# Patient Record
Sex: Female | Born: 1955 | Hispanic: Yes | Marital: Single | Smoking: Never smoker
Health system: Southern US, Community
[De-identification: ages and names within clinical notes are randomized; demographics above are authoritative.]

## PROBLEM LIST (undated history)

## (undated) DIAGNOSIS — C801 Malignant (primary) neoplasm, unspecified: Secondary | ICD-10-CM

## (undated) HISTORY — PX: MASTECTOMY: SHX3

## (undated) HISTORY — PX: BLADDER SURGERY: SHX569

---

## 2017-06-18 ENCOUNTER — Emergency Department (HOSPITAL_COMMUNITY)
Admission: EM | Admit: 2017-06-18 | Discharge: 2017-06-19 | Disposition: A | Discharge status: Home / Self Care | Payer: Medicaid Other | Attending: Emergency Medicine | Admitting: Emergency Medicine

## 2017-06-18 ENCOUNTER — Encounter (HOSPITAL_COMMUNITY): Payer: Self-pay | Admitting: Emergency Medicine

## 2017-06-18 DIAGNOSIS — R1013 Epigastric pain: Secondary | ICD-10-CM | POA: Diagnosis present

## 2017-06-18 DIAGNOSIS — K8020 Calculus of gallbladder without cholecystitis without obstruction: Secondary | ICD-10-CM | POA: Diagnosis not present

## 2017-06-18 DIAGNOSIS — Z853 Personal history of malignant neoplasm of breast: Secondary | ICD-10-CM | POA: Insufficient documentation

## 2017-06-18 HISTORY — DX: Malignant (primary) neoplasm, unspecified: C80.1

## 2017-06-18 LAB — CBC
HCT: 37.2 % (ref 36.0–46.0)
Hemoglobin: 12.3 g/dL (ref 12.0–15.0)
MCH: 28.3 pg (ref 26.0–34.0)
MCHC: 33.1 g/dL (ref 30.0–36.0)
MCV: 85.5 fL (ref 78.0–100.0)
PLATELETS: 257 10*3/uL (ref 150–400)
RBC: 4.35 MIL/uL (ref 3.87–5.11)
RDW: 13.7 % (ref 11.5–15.5)
WBC: 9.8 10*3/uL (ref 4.0–10.5)

## 2017-06-18 LAB — URINALYSIS, ROUTINE W REFLEX MICROSCOPIC
BACTERIA UA: NONE SEEN
Bilirubin Urine: NEGATIVE
GLUCOSE, UA: NEGATIVE mg/dL
KETONES UR: NEGATIVE mg/dL
NITRITE: NEGATIVE
PROTEIN: NEGATIVE mg/dL
Specific Gravity, Urine: 1.013 (ref 1.005–1.030)
Squamous Epithelial / LPF: NONE SEEN
pH: 8 (ref 5.0–8.0)

## 2017-06-18 LAB — LIPASE, BLOOD: LIPASE: 26 U/L (ref 11–51)

## 2017-06-18 LAB — I-STAT TROPONIN, ED: Troponin i, poc: 0 ng/mL (ref 0.00–0.08)

## 2017-06-18 LAB — COMPREHENSIVE METABOLIC PANEL
ALK PHOS: 66 U/L (ref 38–126)
ALT: 47 U/L (ref 14–54)
AST: 59 U/L — AB (ref 15–41)
Albumin: 4.2 g/dL (ref 3.5–5.0)
Anion gap: 12 (ref 5–15)
BILIRUBIN TOTAL: 1.2 mg/dL (ref 0.3–1.2)
BUN: 16 mg/dL (ref 6–20)
CALCIUM: 9.5 mg/dL (ref 8.9–10.3)
CO2: 22 mmol/L (ref 22–32)
CREATININE: 0.84 mg/dL (ref 0.44–1.00)
Chloride: 103 mmol/L (ref 101–111)
Glucose, Bld: 165 mg/dL — ABNORMAL HIGH (ref 65–99)
Potassium: 3.5 mmol/L (ref 3.5–5.1)
Sodium: 137 mmol/L (ref 135–145)
TOTAL PROTEIN: 7.5 g/dL (ref 6.5–8.1)

## 2017-06-18 MED ORDER — GI COCKTAIL ~~LOC~~
30.0000 mL | Freq: Once | ORAL | Status: AC
  Administered 2017-06-19: 30 mL via ORAL
  Filled 2017-06-18: qty 30

## 2017-06-18 NOTE — ED Triage Notes (Signed)
Pt c/o upper abd pain onset 1700 today with n/v. Emesis x 4. Denies diarrhea. Dx in the past with gastritis when she had similar s/s.  Pt is spanish speaking.

## 2017-06-18 NOTE — ED Provider Notes (Signed)
[facility] DEPT Provider Note   CSN: [id number] Arrival date & time: 06/18/17  2254     History   Chief Complaint Chief Complaint  Patient presents with  . Abdominal Pain    HPI Grace Blake is a 61 y.o. female.  Patient is a 61 year old female presenting with complaints of abdominal pain. This started after eating enchiladas earlier this evening. Her pain is located in the epigastric region and associated with nausea, but no vomiting. She denies any constipation or diarrhea. She denies any fevers or chills. She reports similar episodes in the past. She reports seeing a doctor at one time and was told that she had gastritis. She takes omeprazole daily and took an additional dose when this pain started. This did seem to ease her pain somewhat.   The history is provided by the patient.  Abdominal Pain   This is a recurrent problem. The current episode started 3 to 5 hours ago. The problem occurs constantly. The problem has been gradually improving. The pain is associated with eating. The pain is located in the epigastric region. The quality of the pain is cramping. The pain is moderate. Associated symptoms include nausea. Pertinent negatives include fever, vomiting and dysuria. Nothing aggravates the symptoms. Nothing relieves the symptoms.    Past Medical History:  Diagnosis Date  . 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   Past Surgical History:  Procedure Laterality Date  . BLADDER SURGERY    . CESAREAN SECTION    . MASTECTOMY Left     OB History    No data available       Home Medications    Prior to Admission medications   Not on File    Family History No family history on file.  Social History Social History  Substance Use Topics  . Smoking status: Never Smoker  . Smokeless tobacco: Never Used  . Alcohol use No     Allergies   Penicillins   Review of Systems Review of Systems  Constitutional:  Negative for fever.  Gastrointestinal: Positive for abdominal pain and nausea. Negative for vomiting.  Genitourinary: Negative for dysuria.  All other systems reviewed and are negative.    Physical Exam Updated Vital Signs BP 122/62 (BP Location: Right Arm)   Pulse (!) 52   Temp 98 F (36.7 C) (Oral)   Resp 18   Wt 88.9 kg (196 lb)   SpO2 97%   Physical Exam  Constitutional: She is oriented to person, place, and time. She appears well-developed and well-nourished. No distress.  HENT:  Head: Normocephalic and atraumatic.  Neck: Normal range of motion. Neck supple.  Cardiovascular: Normal rate and regular rhythm.  Exam reveals no gallop and no friction rub.   No murmur heard. Pulmonary/Chest: Effort normal and breath sounds normal. No respiratory distress. She has no wheezes.  Abdominal: Soft. Bowel sounds are normal. She exhibits no distension. There is tenderness. There is no rebound and no guarding.  There is tenderness in the epigastric region.  Musculoskeletal: Normal range of motion.  Neurological: She is alert and oriented to person, place, and time.  Skin: Skin is warm and dry. She is not diaphoretic.  Nursing note and vitals reviewed.    ED Treatments / Results  Labs (all labs ordered are listed, but only abnormal results are displayed) Labs Reviewed  CBC  LIPASE, BLOOD  COMPREHENSIVE METABOLIC PANEL  URINALYSIS, ROUTINE W REFLEX MICROSCOPIC  I-STAT TROPONIN, ED    EKG  EKG Interpretation None       Radiology No results found.  Procedures Procedures (including critical care time)  Medications Ordered in ED Medications - No data to display   Initial Impression / Assessment and Plan / ED Course  I have reviewed the triage vital signs and the nursing notes.  Pertinent labs & imaging results that were available during my care of the patient were reviewed by me and considered in my medical decision making (see chart for details).  Patient presents  with complaints of epigastric pain that started after eating earlier this evening. Her symptoms improved dramatically with a GI cocktail. Her laboratory studies are unremarkable and ultrasound does show gallstones with possible thickening of the gallbladder, however no sonographic Murphy sign. She has no right upper quadrant tenderness and I highly doubt the gallbladder as the problem. I feel as though she is appropriate for discharge with pain medicine, increased omeprazole, and follow-up with general surgery to discuss her gallstones.  Final Clinical Impressions(s) / ED Diagnoses   Final diagnoses:  None    New Prescriptions New Prescriptions   No medications on file     Veryl Speak, MD 06/19/17 0202

## 2017-06-19 ENCOUNTER — Emergency Department (HOSPITAL_COMMUNITY): Payer: Medicaid Other

## 2017-06-19 MED ORDER — HYDROCODONE-ACETAMINOPHEN 5-325 MG PO TABS: 1.0000 | ORAL_TABLET | Freq: Four times a day (QID) | ORAL | 0 refills | Status: AC | PRN

## 2017-06-19 NOTE — ED Notes (Signed)
Epigastric pain today after eating an enchalita   Pain is better  Now she still has her gallbladder

## 2017-06-19 NOTE — Discharge Instructions (Signed)
Hydrocodone as prescribed as needed for pain.  Increase your omeprazole to 20 mg twice daily for the next 2 weeks.  Return to the emergency department if you develop worsening pain, high fevers, bloody stools, or other new and concerning symptoms.  Follow-up with general surgery in the next 1-2 weeks to discuss your gallstones.

## 2017-06-19 NOTE — ED Notes (Signed)
To ultrasound

## 2018-08-04 IMAGING — US US ABDOMEN LIMITED
1 series · 14 of 25 positions shown · non-contrast
Comparison: None.

CLINICAL DATA: Epigastric pain tonight.  Nausea and vomiting.

EXAM:
ULTRASOUND ABDOMEN LIMITED RIGHT UPPER QUADRANT

[Series 1: us abdomen limited · 0.26mm/px · 14 of 54 slices shown]
[im 1/54]
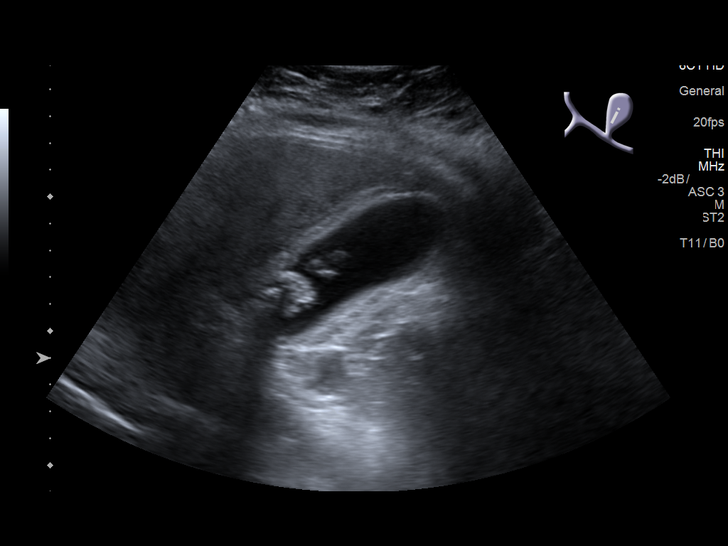
[im 5/54]
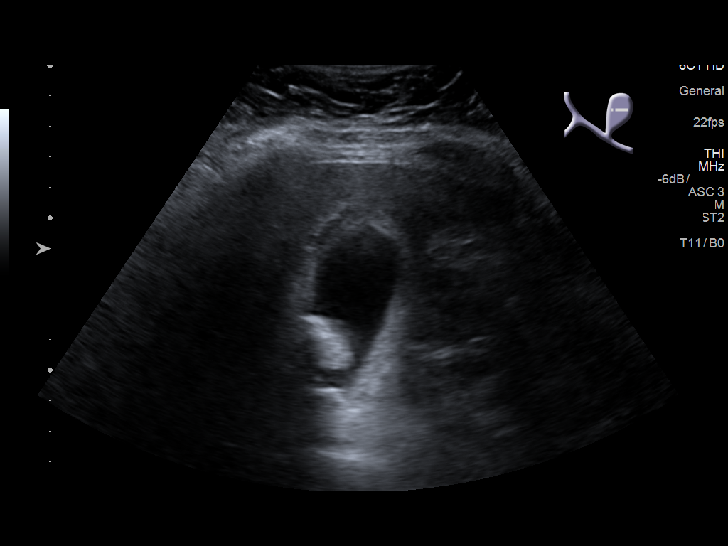
[im 9/54]
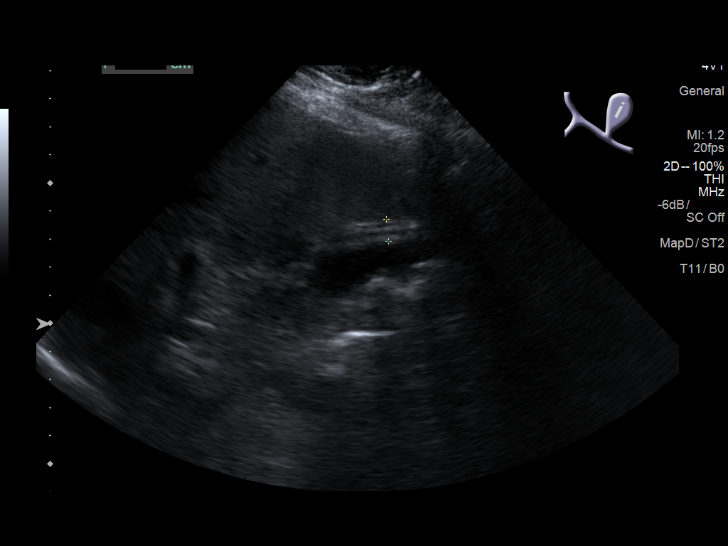
[im 14/54]
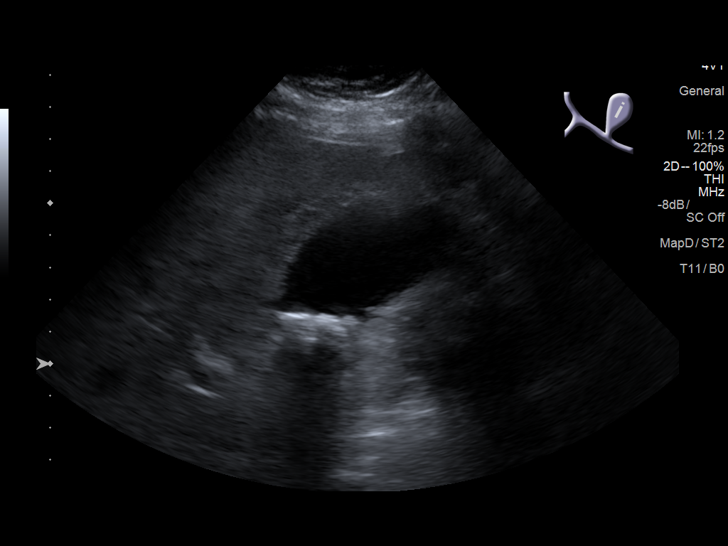
[im 18/54]
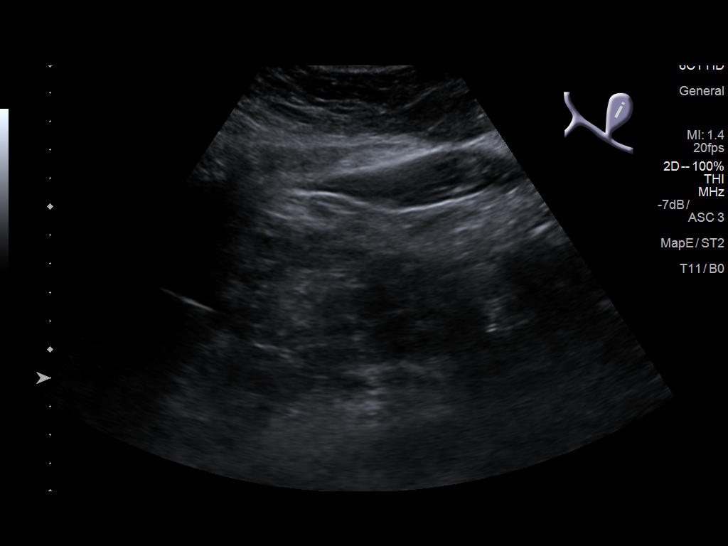
[im 20/54]
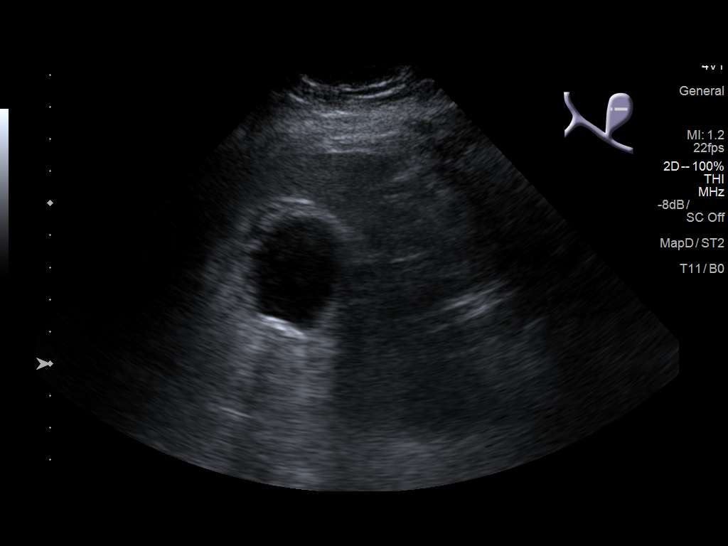
[im 25/54]
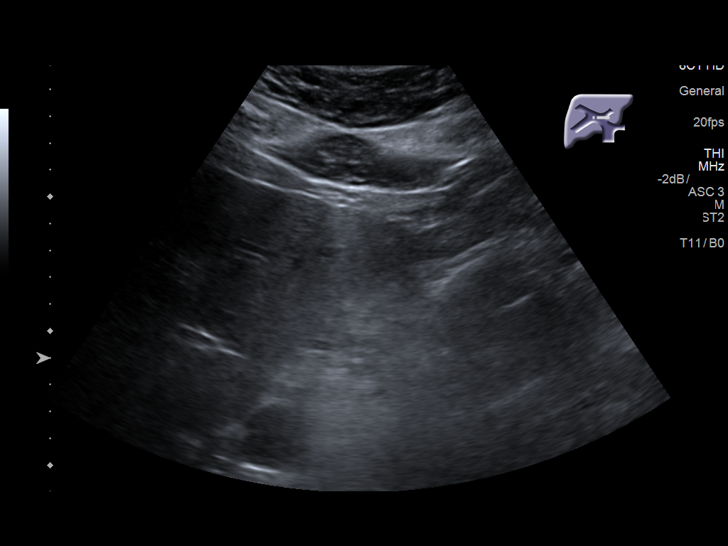
[im 29/54]
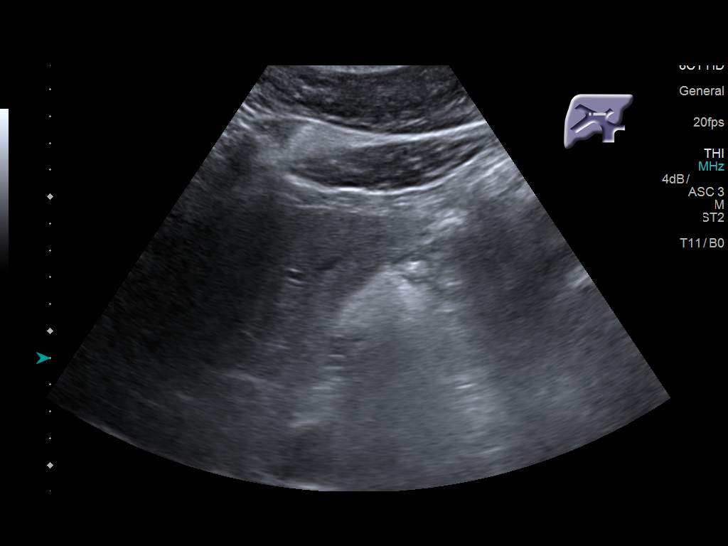
[im 34/54]
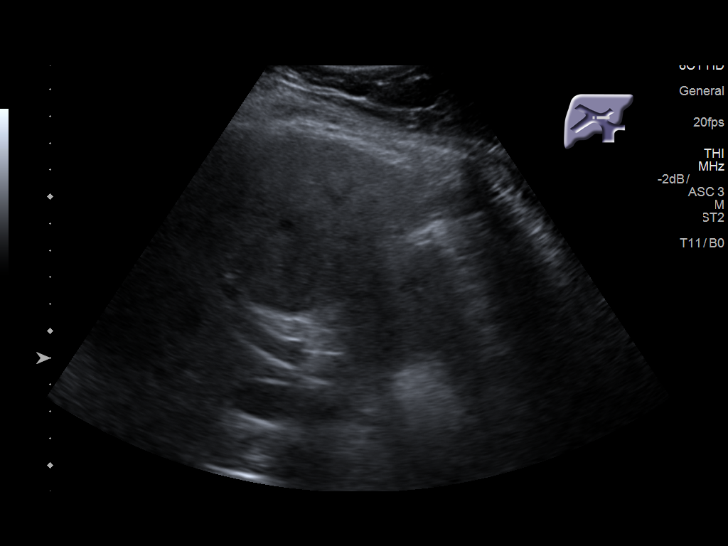
[im 36/54]
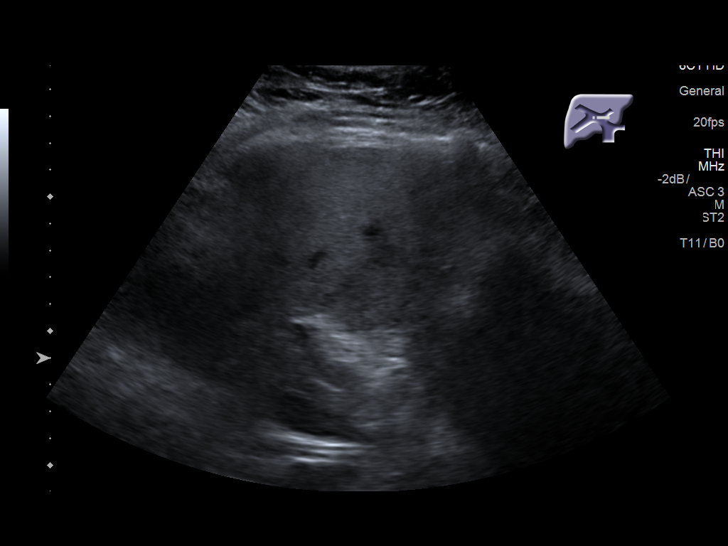
[im 40/54]
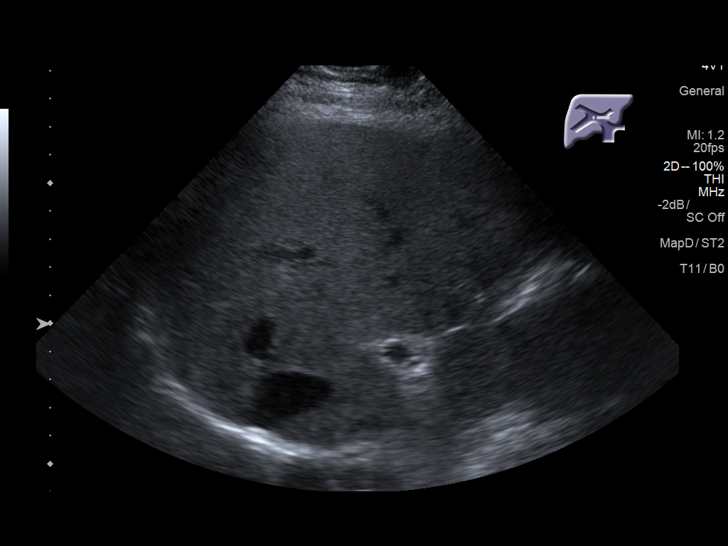
[im 45/54]
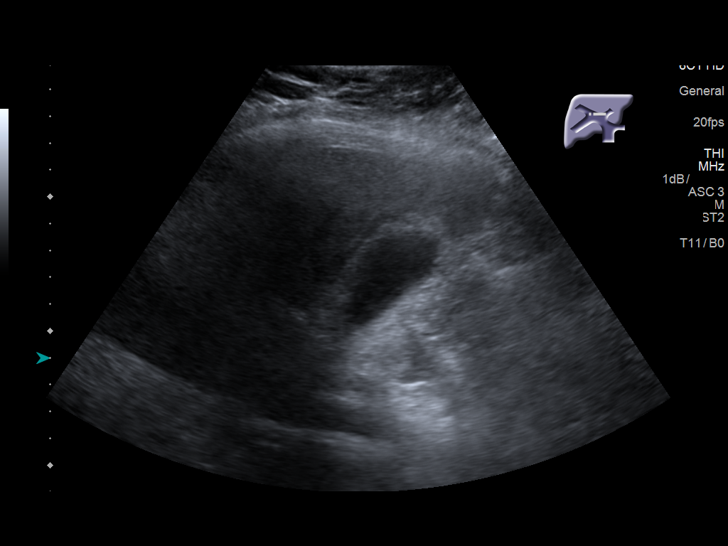
[im 49/54]
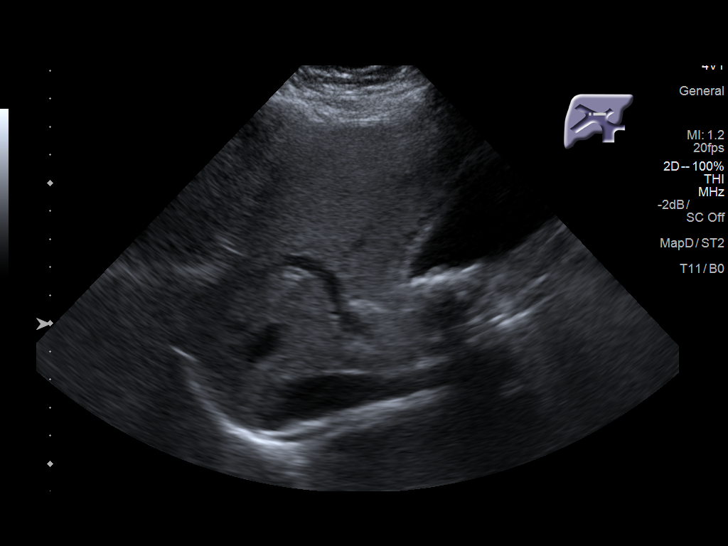
[im 54/54]
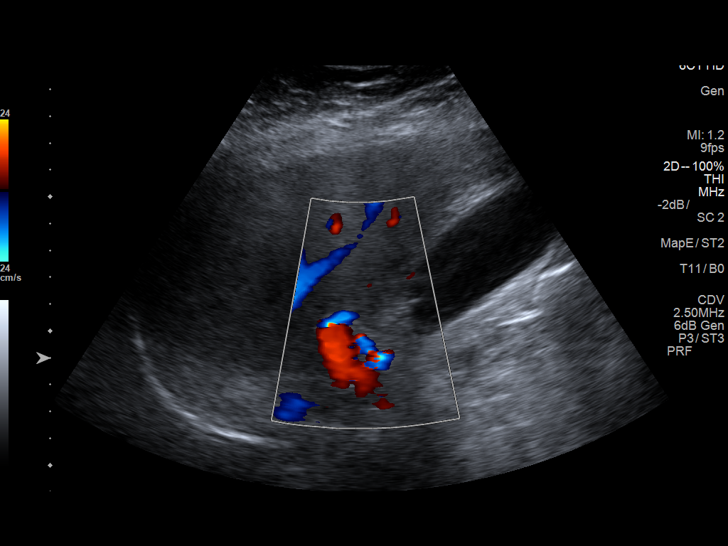

[14 of 25 positions shown; findings below may reference images not displayed]

FINDINGS: Gallbladder:

Multiple intraluminal gallbladder calculi, measuring up to 1.7 cm.
Moderate gallbladder mural thickening, 8 mm. The patient was not
tender to probe pressure over the gallbladder.

Common bile duct:

Diameter: Normal, 4.3 mm

Liver:

Generalized echogenicity of hepatic parenchyma without focal lesion.
Portal vein is patent on color Doppler imaging with normal direction
of blood flow towards the liver.
IMPRESSION: 1. Cholelithiasis, with moderate gallbladder mural thickening but no
tenderness to probe pressure over the gallbladder. This is equivocal
with regard to cholecystitis.
2. Generalized echogenicity of hepatic parenchyma may represent
fatty infiltration or hepatocellular disease. No focal liver
lesions.
